# Patient Record
Sex: Male | Born: 1935 | Race: Black or African American | Hispanic: No | Marital: Single | Smoking: Never smoker
Health system: Southern US, Community
[De-identification: ages and names within clinical notes are randomized; demographics above are authoritative.]

## PROBLEM LIST (undated history)

## (undated) DIAGNOSIS — E119 Type 2 diabetes mellitus without complications: Secondary | ICD-10-CM

## (undated) DIAGNOSIS — M109 Gout, unspecified: Secondary | ICD-10-CM

## (undated) DIAGNOSIS — I10 Essential (primary) hypertension: Secondary | ICD-10-CM

---

## 2018-05-27 ENCOUNTER — Emergency Department (HOSPITAL_COMMUNITY)
Admission: EM | Admit: 2018-05-27 | Discharge: 2018-05-27 | Disposition: A | Discharge status: Home / Self Care | Payer: BLUE CROSS/BLUE SHIELD | Attending: Emergency Medicine | Admitting: Emergency Medicine

## 2018-05-27 ENCOUNTER — Other Ambulatory Visit: Payer: Self-pay

## 2018-05-27 ENCOUNTER — Emergency Department (HOSPITAL_COMMUNITY): Payer: BLUE CROSS/BLUE SHIELD

## 2018-05-27 ENCOUNTER — Encounter (HOSPITAL_COMMUNITY): Payer: Self-pay | Admitting: Emergency Medicine

## 2018-05-27 DIAGNOSIS — R55 Syncope and collapse: Secondary | ICD-10-CM | POA: Insufficient documentation

## 2018-05-27 DIAGNOSIS — I10 Essential (primary) hypertension: Secondary | ICD-10-CM | POA: Insufficient documentation

## 2018-05-27 DIAGNOSIS — E119 Type 2 diabetes mellitus without complications: Secondary | ICD-10-CM | POA: Diagnosis not present

## 2018-05-27 HISTORY — DX: Essential (primary) hypertension: I10

## 2018-05-27 HISTORY — DX: Type 2 diabetes mellitus without complications: E11.9

## 2018-05-27 HISTORY — DX: Gout, unspecified: M10.9

## 2018-05-27 LAB — URINALYSIS, ROUTINE W REFLEX MICROSCOPIC
Bacteria, UA: NONE SEEN
Bilirubin Urine: NEGATIVE
Glucose, UA: NEGATIVE mg/dL
Ketones, ur: NEGATIVE mg/dL
Leukocytes, UA: NEGATIVE
Nitrite: NEGATIVE
Protein, ur: 100 mg/dL — AB
Specific Gravity, Urine: 1.017 (ref 1.005–1.030)
pH: 5 (ref 5.0–8.0)

## 2018-05-27 LAB — BASIC METABOLIC PANEL
Anion gap: 6 (ref 5–15)
BUN: 28 mg/dL — AB (ref 8–23)
CALCIUM: 9.5 mg/dL (ref 8.9–10.3)
CO2: 28 mmol/L (ref 22–32)
CREATININE: 2.09 mg/dL — AB (ref 0.61–1.24)
Chloride: 110 mmol/L (ref 98–111)
GFR calc Af Amer: 32 mL/min — ABNORMAL LOW (ref >60)
GFR calc non Af Amer: 28 mL/min — ABNORMAL LOW (ref >60)
GLUCOSE: 125 mg/dL — AB (ref 70–99)
Potassium: 4.3 mmol/L (ref 3.5–5.1)
Sodium: 144 mmol/L (ref 135–145)

## 2018-05-27 LAB — CBC
HCT: 40.7 % (ref 39.0–52.0)
Hemoglobin: 13.1 g/dL (ref 13.0–17.0)
MCH: 30.1 pg (ref 26.0–34.0)
MCHC: 32.2 g/dL (ref 30.0–36.0)
MCV: 93.6 fL (ref 78.0–100.0)
Platelets: 187 10*3/uL (ref 150–400)
RBC: 4.35 MIL/uL (ref 4.22–5.81)
RDW: 14.5 % (ref 11.5–15.5)
WBC: 7.2 10*3/uL (ref 4.0–10.5)

## 2018-05-27 LAB — CBG MONITORING, ED: Glucose-Capillary: 150 mg/dL — ABNORMAL HIGH (ref 70–99)

## 2018-05-27 MED ORDER — SODIUM CHLORIDE 0.9 % IV BOLUS
1000.0000 mL | Freq: Once | INTRAVENOUS | Status: AC
  Administered 2018-05-27: 1000 mL via INTRAVENOUS

## 2018-05-27 NOTE — ED Triage Notes (Signed)
Pt BIB [facility] after syncopal event. Pt outside, sitting under a tent, bystanders noted pt started leaning to one side and became unresponsive. On EMS arrival, pt remained unresponsive, was pale and diaphoretic. On arrival to ED, pt A&O x 4. Hx diabetes, pt states he thought his blood sugar was dropping. EMS CBG 149, other VSS.

## 2018-05-27 NOTE — Discharge Instructions (Signed)
Follow-up with your PCP when you get back to [place]. Make sure you drink plenty of fluids for the remainder of your trip.

## 2018-06-04 NOTE — ED Provider Notes (Signed)
[facility] EMERGENCY DEPARTMENT Provider Note   CSN: [id number] Arrival date & time: 05/27/18  1945     History   Chief Complaint Chief Complaint  Patient presents with  . Loss of Consciousness    HPI Mark Mcdonald is a 84 y.o. male.  HPI   84 year old male with what sounds like a syncopal event.  Patient is visiting from out of town for sporting event.  He was outside under a tent when some bystanders noted patient's slumped to one side and appeared unresponsive when they assessed him.  On EMS arrival he remained poorly responsive, appeared pale was diaphoretic.  He was reports that his glucose was 149.  He served some IV fluids and by the time he arrived to the emergency room he is alert and oriented x4.  Patient states that he feels like his blood sugar may have dropped but it sounds like it improved without any significant intervention.  He states that he is in his usual state of health earlier today.  He currently has no complaints.  Past Medical History:  Diagnosis Date  . Diabetes mellitus without complication (HCC)   . Gout   . Hypertension     There are no active problems to display for this patient.   History reviewed. No pertinent surgical history.      Home Medications    Prior to Admission medications   Not on File    Family History No family history on file.  Social History Social History   Tobacco Use  . Smoking status: Never Smoker  . Smokeless tobacco: Never Used  Substance Use Topics  . Alcohol use: Not Currently    Frequency: Never  . Drug use: Never     Allergies   Penicillins   Review of Systems Review of Systems  All systems reviewed and negative, other than as noted in HPI.  Physical Exam Updated Vital Signs BP 132/69   Pulse 86   Temp 98.2 F (36.8 C) (Oral)   Resp 16   Ht 5\' 7"  (1.702 m)   Wt 78.9 kg (174 lb)   SpO2 100%   BMI 27.25 kg/m   Physical Exam  Constitutional: He appears  well-developed and well-nourished. No distress.  HENT:  Head: Normocephalic and atraumatic.  Eyes: Conjunctivae are normal. Right eye exhibits no discharge. Left eye exhibits no discharge.  Neck: Neck supple.  Cardiovascular: Normal rate, regular rhythm and normal heart sounds. Exam reveals no gallop and no friction rub.  No murmur heard. Pulmonary/Chest: Effort normal and breath sounds normal. No respiratory distress.  Abdominal: Soft. He exhibits no distension. There is no tenderness.  Musculoskeletal: He exhibits no edema or tenderness.  Neurological: He is alert.  Skin: Skin is warm and dry.  Psychiatric: He has a normal mood and affect. His behavior is normal. Thought content normal.  Nursing note and vitals reviewed.    ED Treatments / Results  Labs (all labs ordered are listed, but only abnormal results are displayed) Labs Reviewed  URINALYSIS, ROUTINE W REFLEX MICROSCOPIC - Abnormal; Notable for the following components:      Result Value   APPearance HAZY (*)    Hgb urine dipstick SMALL (*)    Protein, ur 100 (*)    All other components within normal limits  BASIC METABOLIC PANEL - Abnormal; Notable for the following components:   Glucose, Bld 125 (*)    BUN 28 (*)    Creatinine, Ser 2.09 (*)  GFR calc non Af Amer 28 (*)    GFR calc Af Amer 32 (*)    All other components within normal limits  CBG MONITORING, ED - Abnormal; Notable for the following components:   Glucose-Capillary 150 (*)    All other components within normal limits  CBC    EKG EKG Interpretation  Date/Time:  Thursday May 27 2018 19:49:27 EDT Ventricular Rate:  71 PR Interval:    QRS Duration: 154 QT Interval:  428 QTC Calculation: 466 R Axis:   -69 Text Interpretation:  Sinus rhythm Premature ventricular complexes Prolonged PR interval RBBB and LAFB Confirmed by Ross MarcusHorton, Courtney ([id number]) on 05/29/2018 2:38:23 AM   Radiology No results found.  Procedures Procedures (including critical  care time)  Medications Ordered in ED Medications  sodium chloride 0.9 % bolus 1,000 mL (0 mLs Intravenous Stopped 05/27/18 2300)     Initial Impression / Assessment and Plan / ED Course  I have reviewed the triage vital signs and the nursing notes.  Pertinent labs & imaging results that were available during my care of the patient were reviewed by me and considered in my medical decision making (see chart for details).     84 year old male with what sounds like a syncopal event.  Now back to baseline no further complaints.  Patient is really anxious to leave.  He is visiting from out of town.  Recommend admission, but he is declining.  Renal function noted but no prior labs for comparison purposes.  Suspect chronic to some degree though.  ED work-up has been fairly unremarkable otherwise.  Is remained he dynamically stable.  Advised to drink plenty of fluids number next few days particularly with the heat.  Advised to follow-up when he returns to [place].  Final Clinical Impressions(s) / ED Diagnoses   Final diagnoses:  Near syncope    ED Discharge Orders    None       Raeford RazorKohut, Senna Lape, MD 06/04/18 [phone]

## 2019-12-28 IMAGING — CT CT HEAD W/O CM
3 series · 15 of 47 positions shown, 18 images · non-contrast
Comparison: None.

CLINICAL DATA: Altered level of consciousness. Syncopal event. Pale
and diaphoretic.

EXAM:
CT HEAD WITHOUT CONTRAST
TECHNIQUE: Contiguous axial images were obtained from the base of the skull
through the vertex without intravenous contrast.

[Series 3: head 5.0 h30s · axial · 0.45mm/px · z∈[-69,+76]mm · 9 of 35 slices shown, 12 images]
[im 3/35  brain]
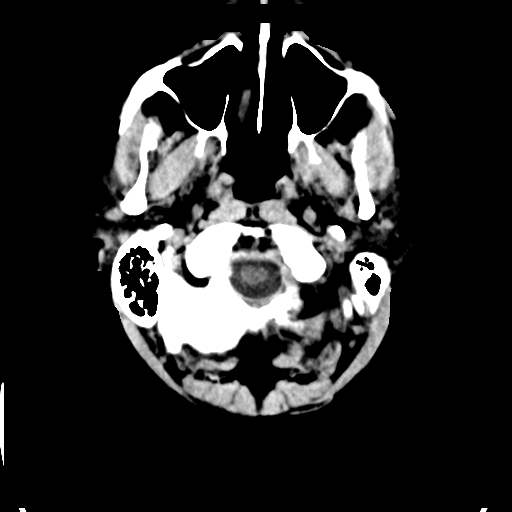
[im 3/35  bone]
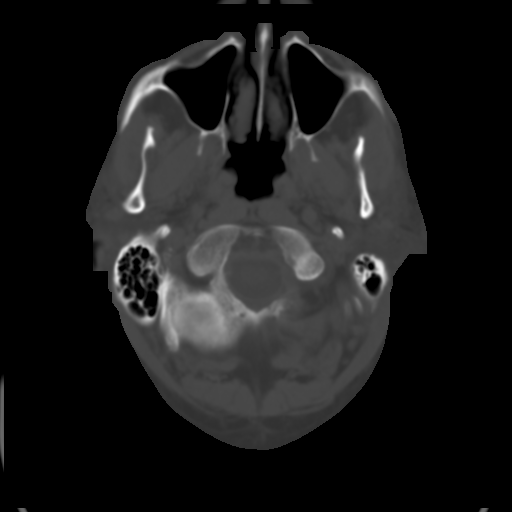
[im 6/35  brain]
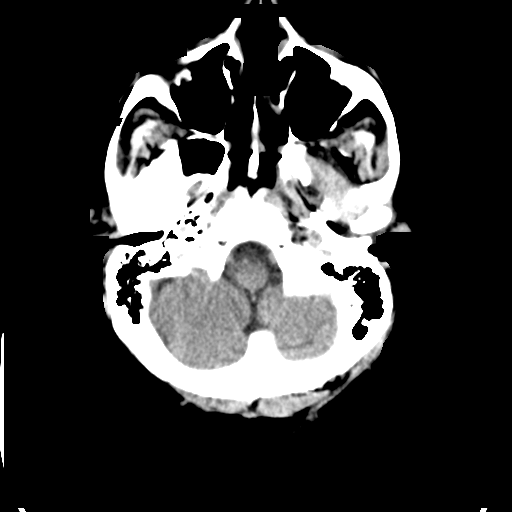
[im 10/35  brain]
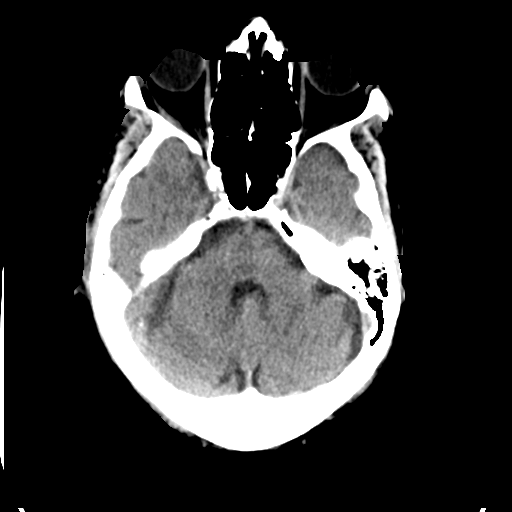
[im 13/35  brain]
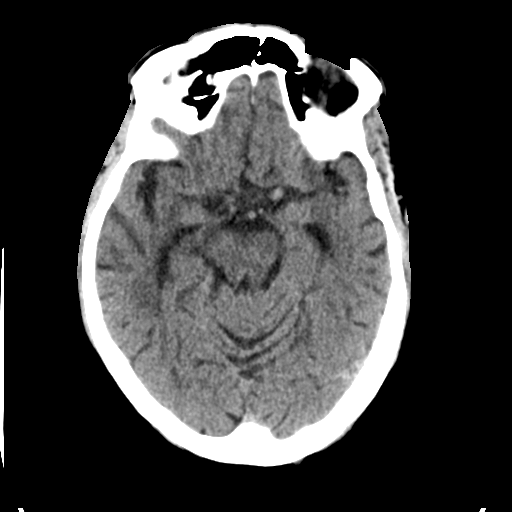
[im 18/35  brain]
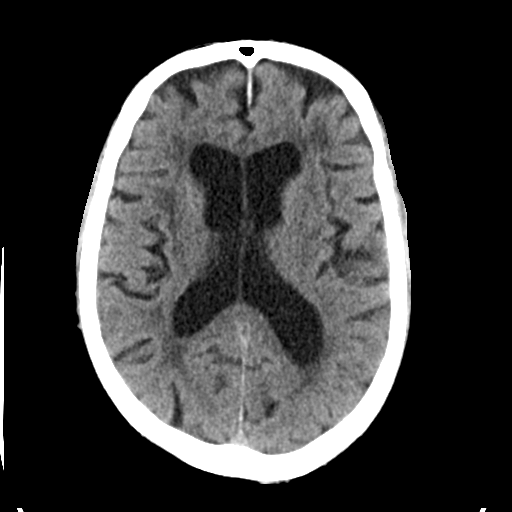
[im 18/35  bone]
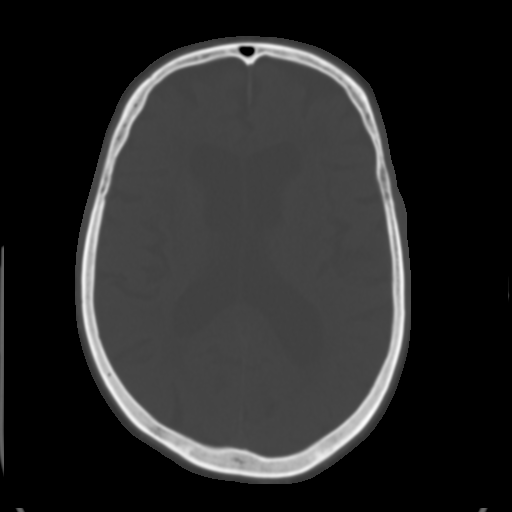
[im 22/35  brain]
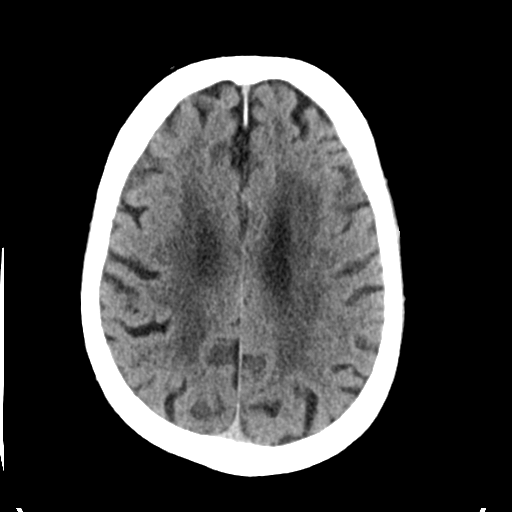
[im 25/35  brain]
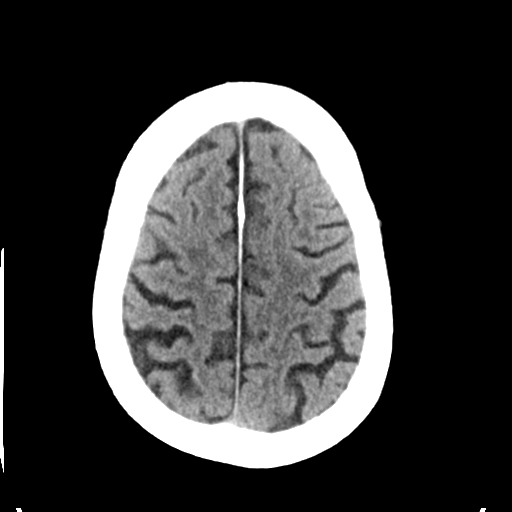
[im 29/35  brain]
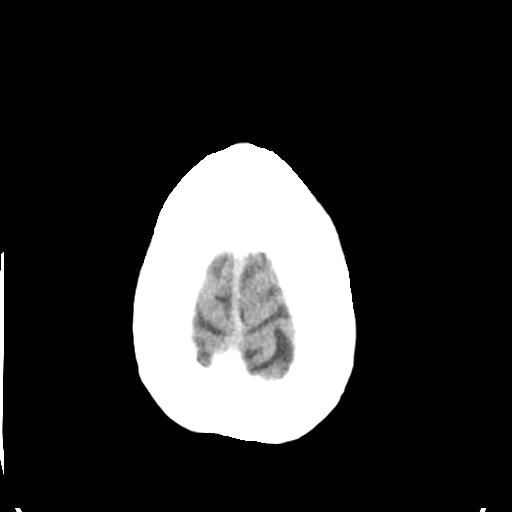
[im 32/35  brain]
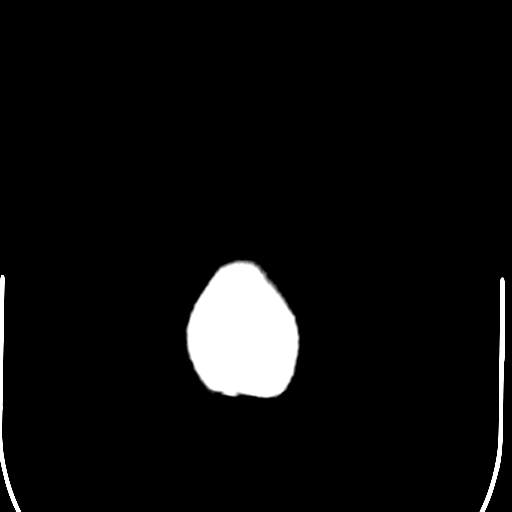
[im 32/35  bone]
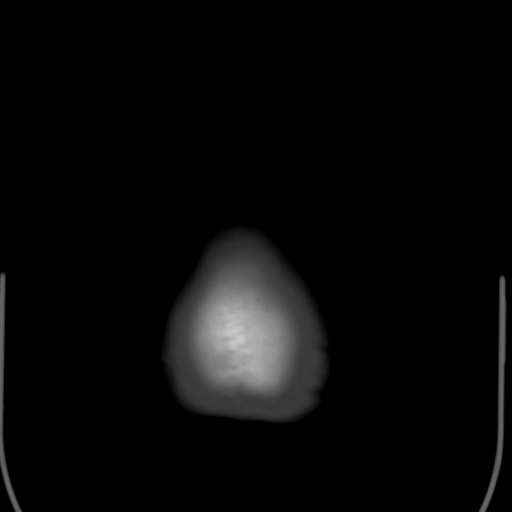

[Series 5: head 3.0 mpr cor · coronal · 0.45mm/px · 3 of 80 slices shown]
[im 27/80  brain]
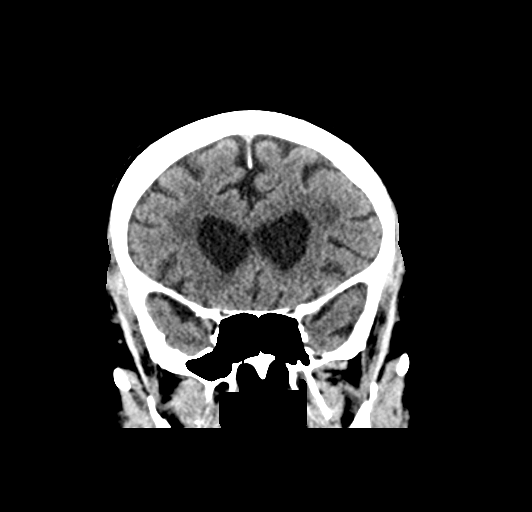
[im 36/80  brain]
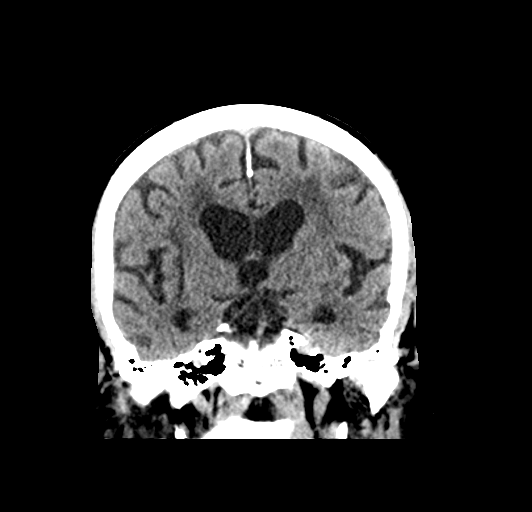
[im 44/80  brain]
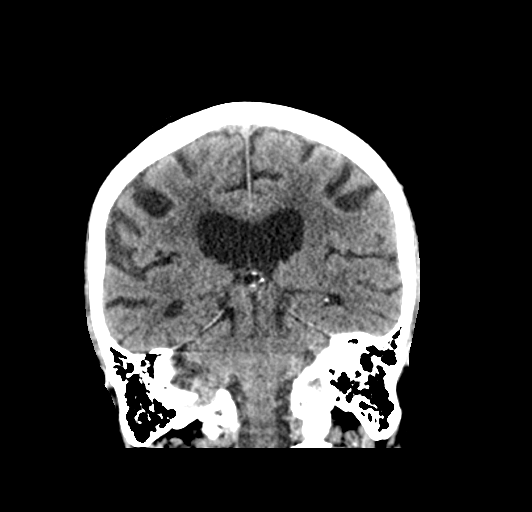

[Series 6: head 3.0 mpr sag · sagittal · 0.33mm/px · 3 of 64 slices shown]
[im 22/64  brain]
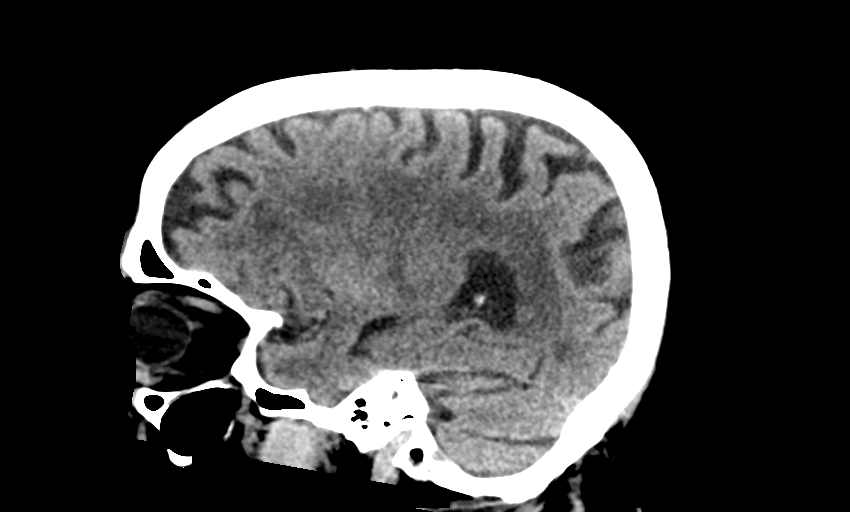
[im 32/64  brain]
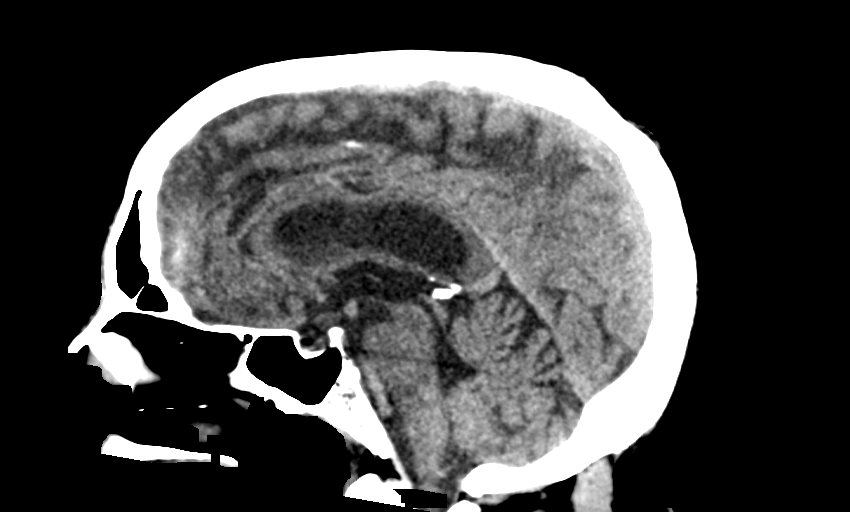
[im 43/64  brain]
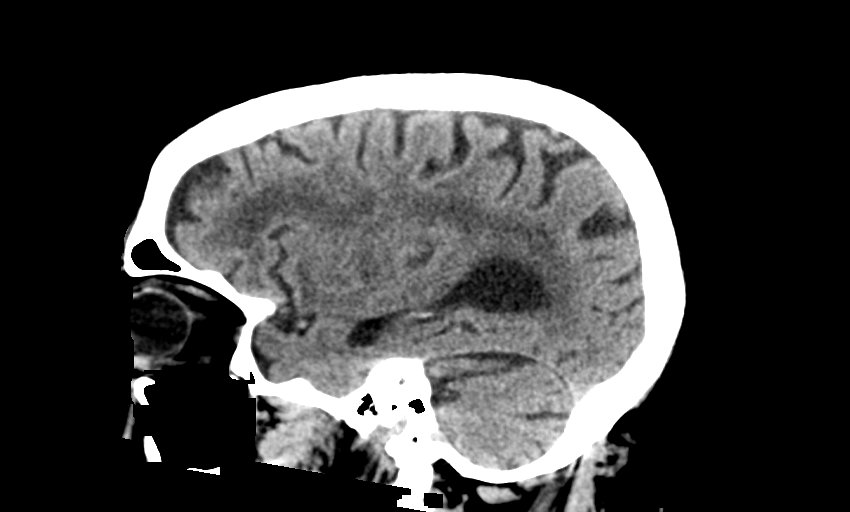

[15 of 47 positions shown; findings below may reference images not displayed]

FINDINGS: Brain: Diffuse cerebral atrophy. Ventricular dilatation consistent
with central atrophy. Low-attenuation changes throughout the deep
white matter consistent with small vessel ischemia. No mass-effect
or midline shift. No abnormal extra-axial fluid collections.
Gray-white matter junctions are distinct. Basal cisterns are not
effaced. No acute intracranial hemorrhage.

Vascular: Intracranial arterial vascular calcifications are present.

Skull: Calvarium appears intact.

Sinuses/Orbits: Paranasal sinuses and mastoid air cells are clear.

Other: None.
IMPRESSION: No acute intracranial abnormalities. Chronic atrophy and small
vessel ischemic changes.

## 2022-11-03 DEATH — deceased
